# Patient Record
Sex: Male | Born: 1970 | Race: Asian | Marital: Married | Smoking: Never smoker
Health system: Southern US, Community
[De-identification: ages and names within clinical notes are randomized; demographics above are authoritative.]

## PROBLEM LIST (undated history)

## (undated) DIAGNOSIS — E785 Hyperlipidemia, unspecified: Secondary | ICD-10-CM

## (undated) DIAGNOSIS — E119 Type 2 diabetes mellitus without complications: Secondary | ICD-10-CM

## (undated) DIAGNOSIS — I10 Essential (primary) hypertension: Secondary | ICD-10-CM

## (undated) HISTORY — DX: Type 2 diabetes mellitus without complications: E11.9

## (undated) HISTORY — DX: Essential (primary) hypertension: I10

## (undated) HISTORY — DX: Hyperlipidemia, unspecified: E78.5

---

## 2007-04-15 IMAGING — CT CT MAXILLOFACIAL W/O CM
1 of 2 series · 15 of 30 positions shown, 19 images · IV contrast (agent unspecified)
Comparison: none

CLINICAL DATA: History of rhinoplasty with implants.  Recurrent swelling and nasal soreness.  
PARANASAL SINUS CT WITHOUT CONTRAST:
TECHNIQUE: Direct coronal and axial CT images were obtained through the paranasal sinuses without intravenous contrast. Soft tissue 3D images were generated.

[Series 4: axial · axial · 0.33mm/px · z∈[-88,+52]mm · 15 of 64 slices shown, 19 images]
[im 4/64  brain]
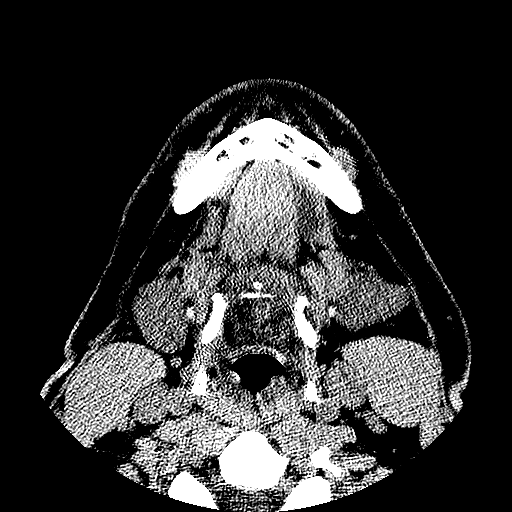
[im 4/64  bone]
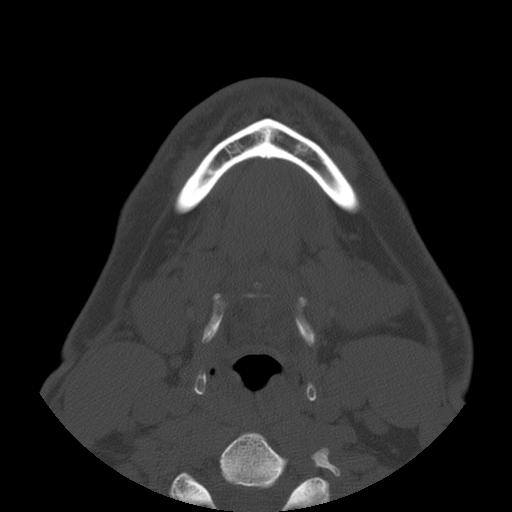
[im 8/64  bone]
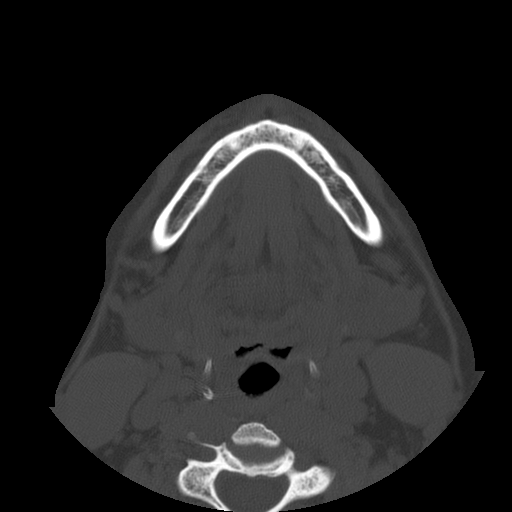
[im 12/64  bone]
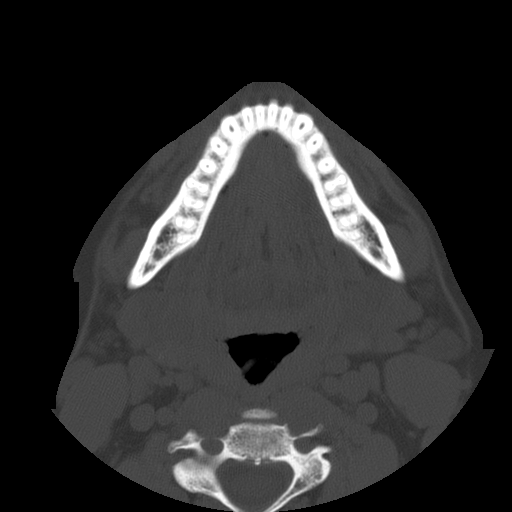
[im 15/64  bone]
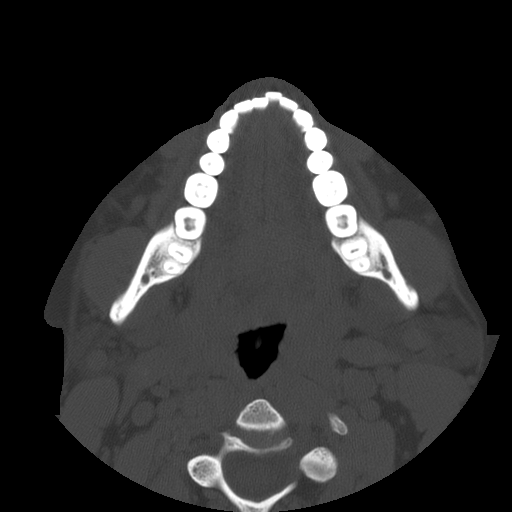
[im 19/64  brain]
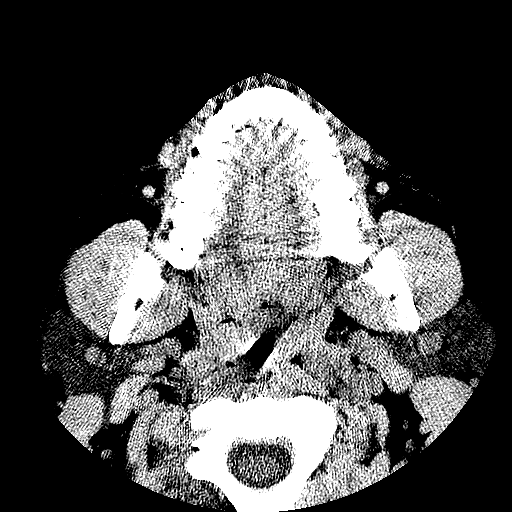
[im 19/64  bone]
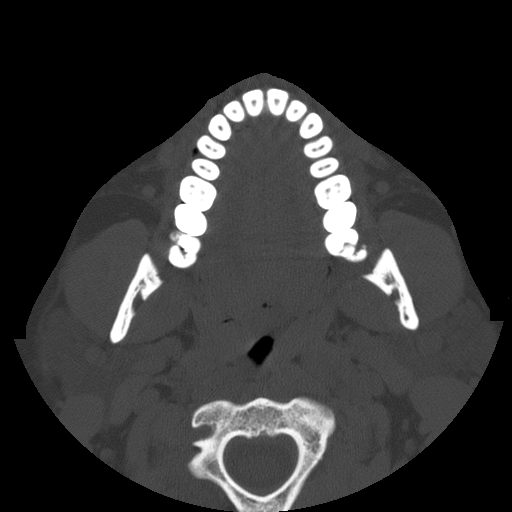
[im 23/64  bone]
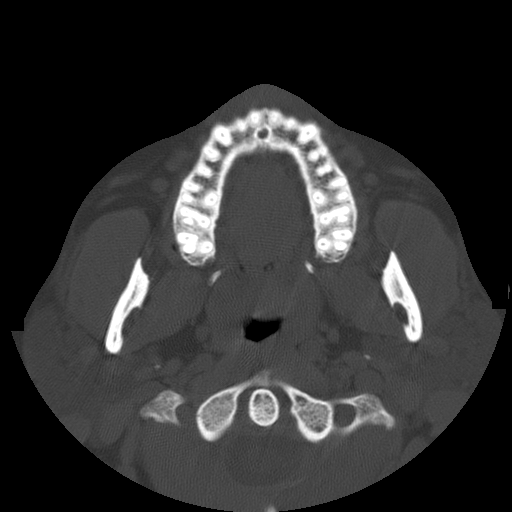
[im 26/64  bone]
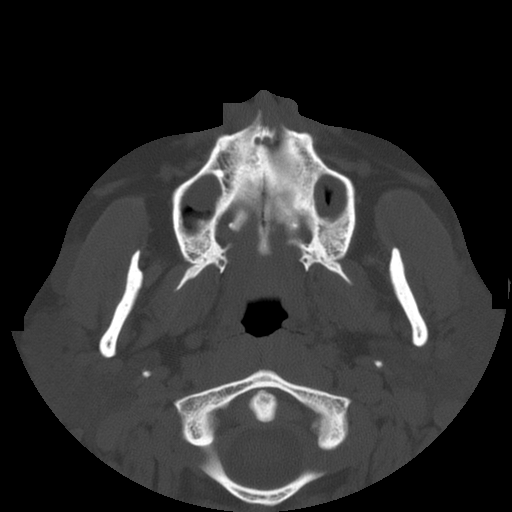
[im 34/64  bone]
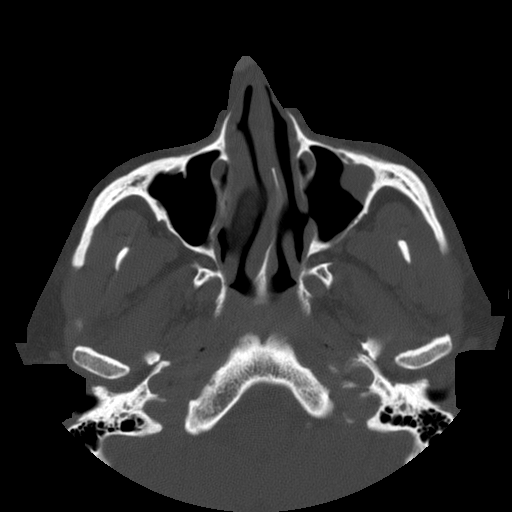
[im 38/64  brain]
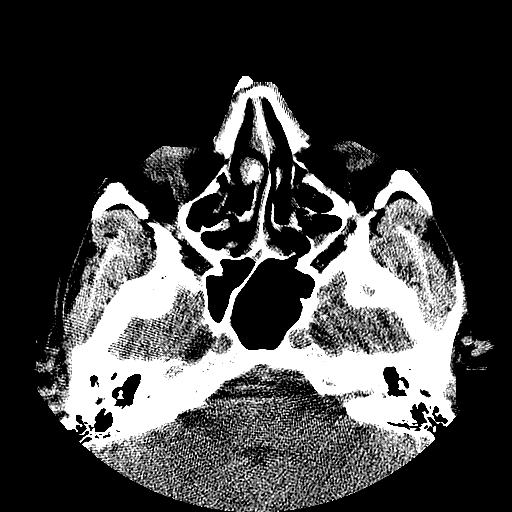
[im 38/64  bone]
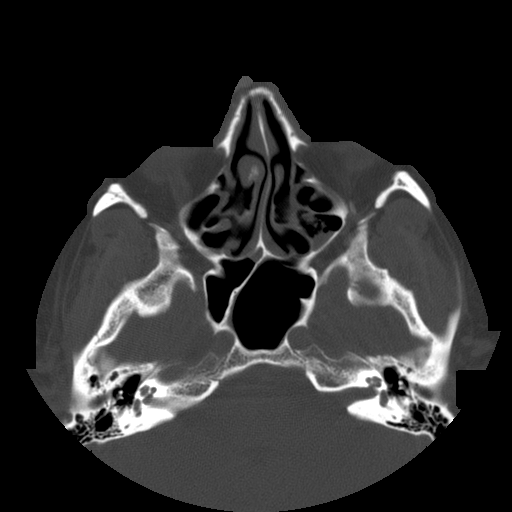
[im 41/64  bone]
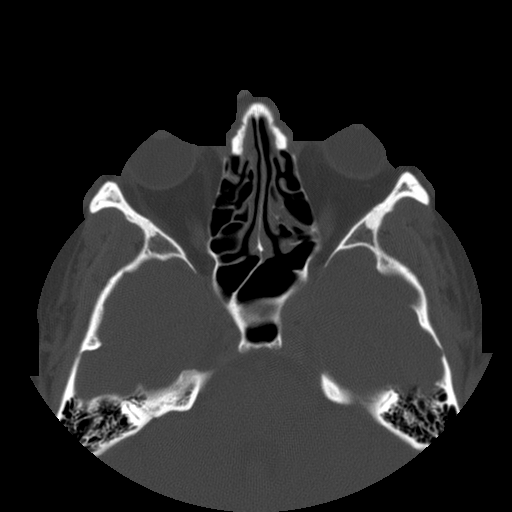
[im 45/64  bone]
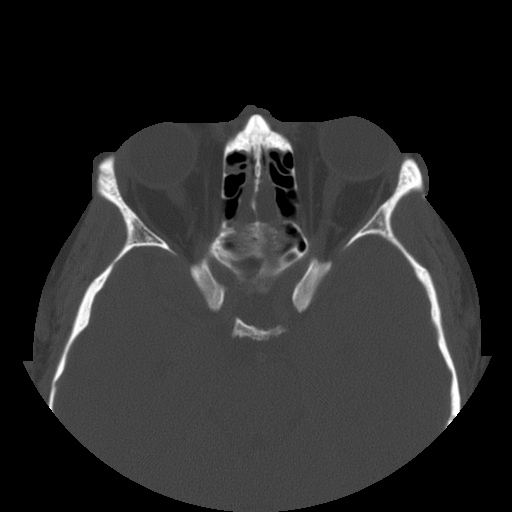
[im 49/64  bone]
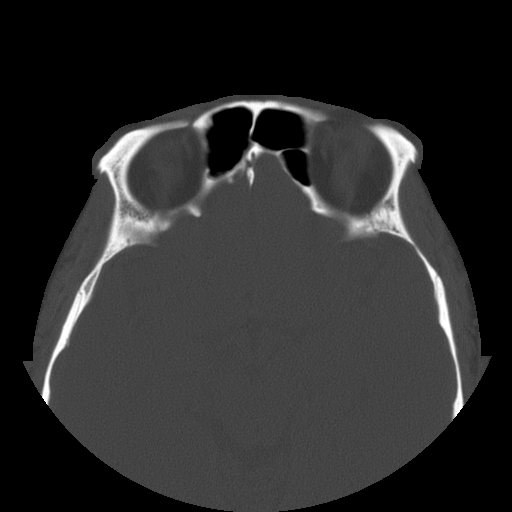
[im 52/64  brain]
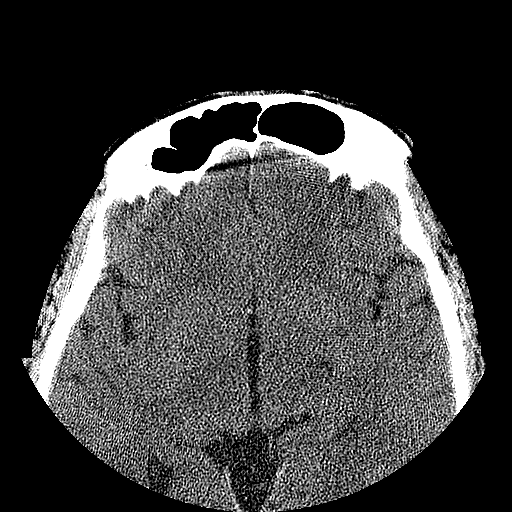
[im 52/64  bone]
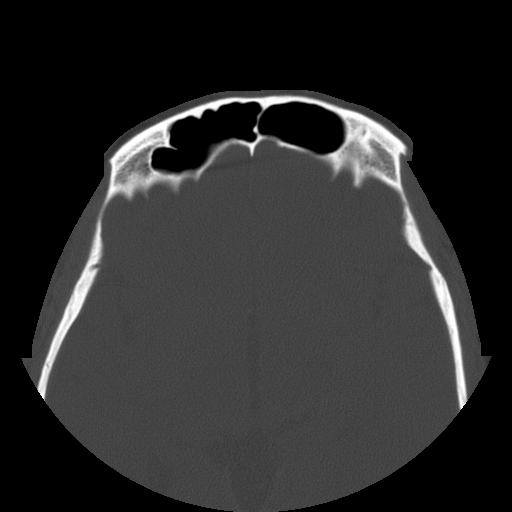
[im 56/64  bone]
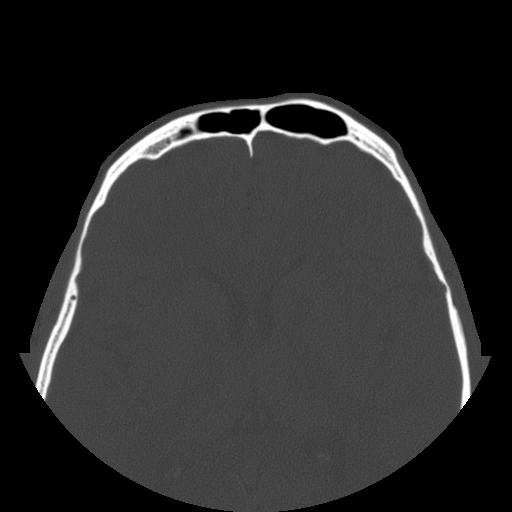
[im 60/64  bone]
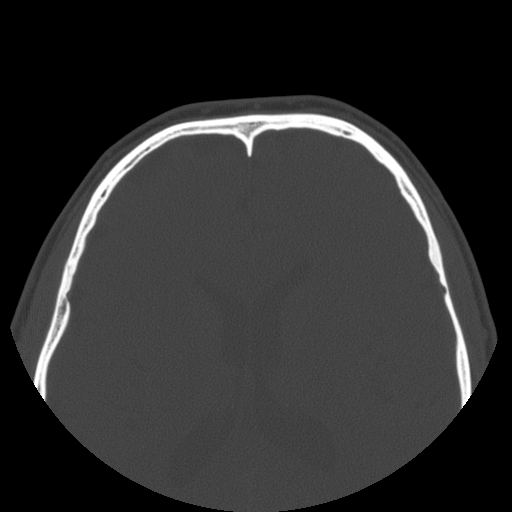

[15 of 30 positions shown; findings below may reference images not displayed]

FINDINGS: Soft tissue implant is noted over the bridge of the nose, slightly off of midline to the right.  There is no evidence of nasal bone fracture or destruction.  Inferior nasal turbinate on the right appears mildly thickened.  There is deviation of the anterior aspect of the bony nasal septum to the left.  Mild infundibular narrowing is present on the left due to an elongated uncinate process and mucosal thickening.  There is additional nodular mucosal thickening in the maxillary sinuses bilaterally.  No air fluid levels are demonstrated.  The frontal, ethmoid, and sphenoid sinuses appear largely clear.  There is minimal opacity in the ethmoid sinuses.  No bone destruction is seen.  Orbital contents appear unremarkable.
IMPRESSION: 1.  Postoperative changes related to nasal soft tissue implant as described.  No bone destruction or obstruction of the nasal passage is identified. 
2.  There is deviation of the bony nasal septum to the left with mild infundibular narrowing on the left and mucosal thickening in the maxillary sinuses bilaterally.  There is also mild ethmoid sinus disease.  No air fluid levels are seen.

## 2019-05-12 ENCOUNTER — Other Ambulatory Visit: Payer: Self-pay | Admitting: "Endocrinology

## 2021-12-29 ENCOUNTER — Other Ambulatory Visit (INDEPENDENT_AMBULATORY_CARE_PROVIDER_SITE_OTHER): Payer: Self-pay | Admitting: "Endocrinology

## 2022-05-19 ENCOUNTER — Encounter (INDEPENDENT_AMBULATORY_CARE_PROVIDER_SITE_OTHER): Payer: Self-pay | Admitting: Internal Medicine

## 2022-05-19 ENCOUNTER — Ambulatory Visit (INDEPENDENT_AMBULATORY_CARE_PROVIDER_SITE_OTHER): Payer: BC Managed Care – PPO | Admitting: Internal Medicine

## 2022-05-19 VITALS — BP 116/78 | HR 80 | Ht 68.0 in | Wt 205.0 lb

## 2022-05-19 DIAGNOSIS — Z8249 Family history of ischemic heart disease and other diseases of the circulatory system: Secondary | ICD-10-CM

## 2022-05-19 DIAGNOSIS — G4733 Obstructive sleep apnea (adult) (pediatric): Secondary | ICD-10-CM

## 2022-05-19 DIAGNOSIS — E785 Hyperlipidemia, unspecified: Secondary | ICD-10-CM

## 2022-05-19 DIAGNOSIS — I158 Other secondary hypertension: Secondary | ICD-10-CM

## 2022-05-19 LAB — ECG 12-LEAD
Atrial Rate: 74 {beats}/min
IHS MUSE NARRATIVE AND IMPRESSION: NORMAL
P Axis: 29 degrees
P-R Interval: 148 ms
Q-T Interval: 374 ms
QRS Duration: 72 ms
QTC Calculation (Bezet): 415 ms
R Axis: 0 degrees
T Axis: 55 degrees
Ventricular Rate: 74 {beats}/min

## 2022-05-19 NOTE — Progress Notes (Signed)
[place] [facility] C[facility] OFFICE CONSULTATION NOTE    [facility]RT [place] [facility] OFFICE - CARDIOLOGY  [address]  [place] [address]  Dept: [phone]  Dept Fax: [phone]         Patient Name: Dennis Wood    Date of Visit:  May 19, 2022  Date of Birth: 11/23/1970  AGE: 51 y.o.  Medical Record #: [id number]  Requesting Physician: Dennis Beams, MD      CHIEF COMPLAINT:  Family history of CAD      HISTORY OF PRESENT ILLNESS    Dennis Wood is being seen today for cardiovascular evaluation at the request of Dennis Levada Dy, MD. He is a pleasant 51 y.o. male who presents for evaluation of family history of CAD.    Patient reports a family history of CAD in his father, who had CABG around age 44.  Patient is interested in getting screened for coronary disease.  He denies any significant chest pain or shortness of breath.    He does report snoring and waking up at night.  He has never been tested for sleep apnea.  He follows closely with his endocrinologist regarding diabetes management.      PAST MEDICAL HISTORY: He has a past medical history of Diabetes mellitus, Hyperlipidemia, and Hypertension. He has no past surgical history on file.    ALLERGIES:   Allergies   Allergen Reactions    Pollen Extract Cough, Itching and Wheezing       MEDICATIONS:  Patient's current medications were reviewed. ONLY Cardiac medications were updated unless others were addressed in assessment and plan.    Current Outpatient Medications:     acarbose (PRECOSE) 25 MG tablet, Take 1 tablet (25 mg) by mouth 3 (three) times daily with meals, Disp: , Rfl:     atorvastatin (LIPITOR) 40 MG tablet, Take 1 tablet (40 mg) by mouth daily, Disp: , Rfl:     ezetimibe (ZETIA) 10 MG tablet, Take 1 tablet (10 mg) by mouth daily, Disp: , Rfl:     Farxiga 10 MG Tab, Take 1 tablet (10 mg) by mouth daily, Disp: , Rfl:     Janumet XR 50-1000 MG Tablet SR 24 hr, , Disp: , Rfl:      levothyroxine (Synthroid) 100 MCG tablet, , Disp: , Rfl:     pioglitazone-metFORMIN (ACTOPLUS MET) 15-500 MG per tablet, , Disp: , Rfl:     Rybelsus 14 MG Tab, , Disp: , Rfl:     vitamin D, ergocalciferol, (DRISDOL) 50000 UNIT Cap, , Disp: , Rfl:      FAMILY HISTORY: family history is not on file.    SOCIAL HISTORY: He reports that he has never smoked. He has never used smokeless tobacco. He reports current alcohol use of about 2.0 standard drinks of alcohol per week. He reports that he does not use drugs.    PHYSICAL EXAMINATION    Visit Vitals  BP 116/78 (BP Site: Right arm, Patient Position: Sitting, Cuff Size: Medium)   Pulse 80   Ht 1.727 m (5\' 8" )   Wt 93 kg (205 lb)   BMI 31.17 kg/m        Constitutional: Cooperative, alert, no acute distress.  Neck: No carotid bruits, JVP normal.  Cardiac: Regular rate and rhythm, normal S1 and S2; no S3 or S4, no murmurs, no rubs, no gallops.  Pulmonary: Clear to auscultation bilaterally, no wheezing, no rhonchi, no rales.  Extremities: no  edema.  Vascular: +2 pulses in radial artery bilaterally, 2+ pedal pulses bilaterally.      ECG: EKG today shows sinus rhythm, no significant abnormalities.      LABS:   No results found for: "WBC", "HGB", "HCT", "PLT"  No results found for: "GLU", "BUN", "CREAT", "NA", "K", "CL", "CO2", "AST", "ALT"  No results found for: "MG", "TSH", "HGBA1C", "BNP"  No results found for: "CHOL", "TRIG", "HDL", "LDL", "LPACHOL"    Labs from September 2023 with total cholesterol 135, HDL 35, LDL 71, triglycerides 192, creatinine 1.0, Normal CMP, A1c 5.3, TSH 2.7.    EKG and Labs personally reviewed.      IMPRESSION:   Dennis Wood is a 51 y.o. male with the following problems:    Hyperlipidemia  Type 2 diabetes  Snoring, waking up at night, concern for OSA  Family history of CAD and CABG in his father around age 62    RECOMMENDATIONS:    He reports a family history of CAD in his father around age 49.  Patient would like to be screened for atherosclerosis  given his family history and risk factors  Check coronary calcium scan to evaluate for coronary calcifications  Will check exercise treadmill stress test to evaluate for ischemia  Check echocardiogram to evaluate heart structure and function  He has symptoms of snoring as well as poor sleep as he wakes up multiple times at night.  There is concern for OSA.  We will check home sleep study to evaluate for OSA  Continue current cardiac meds  Follow-up as needed                                                 Orders Placed This Encounter   Procedures    Cardiac Stress Test    CT Cardiac Scoring    ECG 12 lead (Normal)    Echo Transthoracic Adult Complete    Home Sleep Study (HST)       No orders of the defined types were placed in this encounter.        SIGNED:    Anthoney Harada, MD         This note was generated by the Dragon speech recognition and may contain errors or omissions not intended by the user. Grammatical errors, random word insertions, deletions, pronoun errors, and incomplete sentences are occasional consequences of this technology due to software limitations. Not all errors are caught or corrected. If there are questions or concerns about the content of this note or information contained within the body of this dictation, they should be addressed directly with the author for clarification.

## 2022-05-23 ENCOUNTER — Ambulatory Visit (INDEPENDENT_AMBULATORY_CARE_PROVIDER_SITE_OTHER): Payer: BC Managed Care – PPO | Admitting: Internal Medicine

## 2022-05-23 ENCOUNTER — Encounter (INDEPENDENT_AMBULATORY_CARE_PROVIDER_SITE_OTHER): Payer: Self-pay | Admitting: Internal Medicine

## 2022-05-23 DIAGNOSIS — E785 Hyperlipidemia, unspecified: Secondary | ICD-10-CM

## 2022-05-23 DIAGNOSIS — Z8249 Family history of ischemic heart disease and other diseases of the circulatory system: Secondary | ICD-10-CM

## 2022-05-23 DIAGNOSIS — I158 Other secondary hypertension: Secondary | ICD-10-CM

## 2022-05-23 DIAGNOSIS — G4733 Obstructive sleep apnea (adult) (pediatric): Secondary | ICD-10-CM

## 2022-05-23 NOTE — Procedures (Signed)
EXERCISE STRESS TEST    [facility] [place] [facility]  [place] [facility] OFFICE - CARDIOLOGY  [address]  [place] [address]  Dept: [phone]  Dept Fax: [phone]     Patient: Dennis Wood  Sex: Male   DOB: 1971-02-03 (51 y.o.)  MRN:  [id number]     Test Date:  05/23/2022       Interpretation Date: 05/23/2022    Referring Physician: Aliene Beams, MD     CLINICIAN: Melany Guernsey, CT  SUPERVISING PROVIDER: Anthoney Harada, MD    MEDICATIONS:  Patient has a current medication list which includes the following prescription(s): acarbose - Take 1 tablet (25 mg) by mouth 3 (three) times daily with meals, atorvastatin - Take 1 tablet (40 mg) by mouth daily, ezetimibe - Take 1 tablet (10 mg) by mouth daily, farxiga - Take 1 tablet (10 mg) by mouth daily, janumet xr, levothyroxine, pioglitazone-metformin, rybelsus, and vitamin d (ergocalciferol).    INDICATION:  (ischemic evaluation)    GRADED ECG EXERCISE TEST:    ----- Protocol and Exercise Time -----   Protocol Bruce   Exercise Time (min) 10:00   ----- Heart Rate -----   Resting HR 90 bpm   Maximum HR 176 bpm   METS 11.2 METS   Percent Maximum Predicted HR 104 %    -----Blood Pressure -----   Resting BP 114/24mmHg   Maximum BP 176/80 mmHg     Patient Symptoms: None    Reason for end: Fatigue    Resting ECG: Sinus Rhythm  ST Changes: No significant ST changes from baseline  Stress ECG: Negative for ischemia    Arrhythmias: None    INTERPRETATION:  Normal blood pressure response to exercise.  Exercise capacity average for age and gender.  No chest pain or ischemic ECG changes with exercise.    CONCLUSIONS:  Normal maximal exercise treadmill test with no clinical or ECG evidence of ischemia.  Results and recommendations conveyed to the patient.    INTERPRETED BY: Anthoney Harada, MD

## 2022-05-25 ENCOUNTER — Encounter (INDEPENDENT_AMBULATORY_CARE_PROVIDER_SITE_OTHER): Payer: Self-pay

## 2022-06-26 ENCOUNTER — Ambulatory Visit (INDEPENDENT_AMBULATORY_CARE_PROVIDER_SITE_OTHER): Payer: BC Managed Care – PPO

## 2022-06-26 DIAGNOSIS — Z8249 Family history of ischemic heart disease and other diseases of the circulatory system: Secondary | ICD-10-CM

## 2022-06-26 DIAGNOSIS — G4733 Obstructive sleep apnea (adult) (pediatric): Secondary | ICD-10-CM

## 2022-06-26 DIAGNOSIS — I158 Other secondary hypertension: Secondary | ICD-10-CM

## 2022-06-26 DIAGNOSIS — E785 Hyperlipidemia, unspecified: Secondary | ICD-10-CM

## 2022-06-27 ENCOUNTER — Encounter (INDEPENDENT_AMBULATORY_CARE_PROVIDER_SITE_OTHER): Payer: Self-pay | Admitting: Internal Medicine

## 2022-07-13 ENCOUNTER — Encounter: Payer: BC Managed Care – PPO | Admitting: Pulmonary Disease

## 2022-07-13 ENCOUNTER — Ambulatory Visit (INDEPENDENT_AMBULATORY_CARE_PROVIDER_SITE_OTHER): Payer: BC Managed Care – PPO

## 2022-07-13 ENCOUNTER — Encounter (INDEPENDENT_AMBULATORY_CARE_PROVIDER_SITE_OTHER): Payer: Self-pay

## 2022-07-13 DIAGNOSIS — G4733 Obstructive sleep apnea (adult) (pediatric): Secondary | ICD-10-CM

## 2022-07-13 NOTE — Progress Notes (Signed)
[facility] [place]  [address] [place] [address]  Dept: [phone]  Dept Fax: [phone]     HOME SLEEP TESTING Device Set Up  Set-up Date: July 13, 2022  Set up By: Rodolph Bong, RT    Patient NAME: Dennis Wood   DOB:  Nov 19, 1970  MRN:  [id number]    [place] Unit #:   [id number]  HST Scheduled Return Date: 07/14/2022      All questions and concerns addressed.  Written and verbal instructions provided.     SIGNED:    Rodolph Bong, RT

## 2022-07-14 ENCOUNTER — Encounter (INDEPENDENT_AMBULATORY_CARE_PROVIDER_SITE_OTHER): Payer: Self-pay

## 2022-07-19 ENCOUNTER — Other Ambulatory Visit (INDEPENDENT_AMBULATORY_CARE_PROVIDER_SITE_OTHER): Payer: Self-pay | Admitting: Internal Medicine

## 2022-07-19 DIAGNOSIS — E785 Hyperlipidemia, unspecified: Secondary | ICD-10-CM

## 2022-07-19 DIAGNOSIS — Z8249 Family history of ischemic heart disease and other diseases of the circulatory system: Secondary | ICD-10-CM

## 2022-07-19 DIAGNOSIS — I158 Other secondary hypertension: Secondary | ICD-10-CM

## 2022-07-19 DIAGNOSIS — G4733 Obstructive sleep apnea (adult) (pediatric): Secondary | ICD-10-CM

## 2022-07-26 NOTE — Progress Notes (Unsigned)
SLEEP MEDICINE TELEMEDICINE CONSULTATION NOTE    HRT [place]  [address]  [place] [address]  Dept: [phone]  Dept Fax: [phone]         Patient Name: Dennis Wood    Date of Visit:  July 27, 2022  Date of Birth: 01/11/1971  AGE: 52 y.o.  Medical Record #: [id number]  Requesting Physician: Laurence Ferrari, MD    CHIEF COMPLAINT:  Follow-up (Sleep apnea)      HISTORY OF PRESENT ILLNESS  The visit today was conducted via telemedicine due to COVID-19 precautions.  The patient was in their home and was informed and gave verbal consent to proceed.    Dennis Wood is being seen today for sleep medicine evaluation at the [facility] at the request of Bijal Juanita Laster, MD.     [name] is a pleasant 52 y.o. male new to [facility] presenting for new patient evaluation.  Past medical history significant for hypertension, hyperlipidemia, DM.    He does follow with [facility] cardiology, last seen by Dr. Cleora Fleet 05/19/2022.  He was recommended to have sleep study for evaluation of possible underlying sleep apnea given history of snoring, fragmented sleep and poor quality sleep.    HST 07/13/2022: Mild OSA with AHI 10.7, O2 min 87%, worse in supine position.    Patient states that he has been told by his kids that he does snore.  He does note that he likes time to unwind prior to going to bed so he is usually watching TV and using his phone and ends up going to sleep wait around midnight to 1 AM.  Wakes up between 7 to 8 AM.  He does not usually wake up in the middle of the night but will occasionally take naps during the daytime.  He does drink caffeine daily and alcohol once or twice per week.  No known family history of sleep apnea.  He states he is not particularly sleepy during the daytime.    PAST MEDICAL HISTORY: He has a past medical history of Diabetes mellitus, Hyperlipidemia,  and Hypertension. He has no past surgical history on file.    ALLERGIES:   Allergies   Allergen Reactions    Pollen Extract Cough, Itching and Wheezing       MEDICATIONS:   Patient's current medications were reviewed. ONLY Sleep Medicine related medications were updated unless others were addressed in assessment and plan.  Current Outpatient Medications   Medication Instructions    acarbose (PRECOSE) 25 MG tablet Take 1 tablet (25 mg) by mouth 3 (three) times daily with meals    atorvastatin (LIPITOR) 40 MG tablet Take 1 tablet (40 mg) by mouth daily    ezetimibe (ZETIA) 10 MG tablet Take 1 tablet (10 mg) by mouth daily    Farxiga 10 MG Tab Take 1 tablet (10 mg) by mouth daily    Janumet XR 50-1000 MG Tablet SR 24 hr     levothyroxine (Synthroid) 100 MCG tablet     pioglitazone-metFORMIN (ACTOPLUS MET) 15-500 MG per tablet     Rybelsus 14 MG Tab     vitamin D, ergocalciferol, (DRISDOL) 50000 UNIT Cap        FAMILY HISTORY: family history is not on file.      SOCIAL HISTORY: He reports that he has never smoked. He has never used smokeless tobacco. He reports current alcohol use of  about 2.0 standard drinks of alcohol per week. He reports that he does not use drugs.    REVIEW OF SYSTEMS:    See HPI.      PHYSICAL EXAMINATION    Visit Vitals  Ht 1.727 m (5\' 8" )   Wt 93.4 kg (206 lb)   BMI 31.32 kg/m        Constitutional: Cooperative, alert and oriented,well developed, well nourished, in no acute distress.  Head: Normocephalic, normal hair pattern  ENT: No pallor or cyanosis.  Good dentition, good TMJ movement.  Eyes: conjunctivae and lids normal   Chest: Normal respiration without extremus   Neurological: No gross motor or sensory deficits noted  Psych: affect appropriate, oriented to time, person and place.    LABS:   No results found for: "WBC", "HGB", "HCT", "PLT"  No results found for: "IRON", "TIBC", "FERRITIN"  No results found for: "MG", "TSH", "HGBA1C", "BNP"    Assessment and Plan:  [name] is a 52 y.o.  male presenting today for Sleep Medicine consultation.     Mild obstructive Sleep Apnea:  > Initial presentation: Snoring, fragmented sleep, daytime sleepiness  > Sleep testing: HST 07/13/2022: Mild OSA with AHI 10.7, O2 min 87%, worse in supine position.    We discussed the results of his sleep study in detail.  We discussed the pathophysiology of OSA and risks of untreated sleep apnea including cardiovascular disease, stroke, cognitive deficits, arrhythmias, in addition to potential treatment options including but not limited to positive airway pressure (PAP), oral appliance for sleep apnea, eXcite OSA, upper airway procedures and the like.    1.  At this point patient wishes to give further thought before committing to therapy.  He is considering CPAP therapy and will let Dennis Wood know within the next few weeks regarding his decision.  In the meanwhile he will pursue conservative approach to therapy at this time with lateral sleeping position with head of bed elevation, working on weight loss as well.   2. We discussed how body position during sleep, weight, alcohol and certain anxiety/muscle medications can affect sleep apnea.   3. The patient was advised not to drive or participate in activities requiring sustained vigilance if feeling sleepy.   4. In the mean time, head of bed elevation / side sleeping position was discussed.    Hypertension/hyperlipidemia:  We discussed the relationship between sleep apnea and cardiovascular processes such as hypertension, dysrhythmias, stroke, CAD and the like and how treating underlying sleep apnea could help reduce future risks for occurrence.   1. stable and pt following with [facility] cardiology team.  2. I have reviewed cardiology notes.  3. Echo from 06/2022 reviewed - EF 57%.    DM:  We discussed the relationship between blood sugar control and obstructive sleep apnea and how treating underlying sleep apnea could help with blood sugar control.   1. stable and pt is  following with primary team.    Weight:  We discussed the relationship of body weight and obstructive sleep apnea severity and how changes in body weight could effect sleep apnea severity.    Reviewed above lab work, sleep study results and cardiology notes.                                                    No orders of the  defined types were placed in this encounter.      No orders of the defined types were placed in this encounter.        SIGNED:    Garlan Fair, DO  Sleep Medicine      This note was generated by the Dragon speech recognition and may contain errors or omissions not intended by the user. Grammatical errors, random word insertions, deletions, pronoun errors, and incomplete sentences are occasional consequences of this technology due to software limitations. Not all errors are caught or corrected. If there are questions or concerns about the content of this note or information contained within the body of this dictation, they should be addressed directly with the author for clarification.

## 2022-07-27 ENCOUNTER — Telehealth (INDEPENDENT_AMBULATORY_CARE_PROVIDER_SITE_OTHER): Payer: BC Managed Care – PPO | Admitting: Student in an Organized Health Care Education/Training Program

## 2022-07-27 ENCOUNTER — Encounter (INDEPENDENT_AMBULATORY_CARE_PROVIDER_SITE_OTHER): Payer: Self-pay | Admitting: Student in an Organized Health Care Education/Training Program

## 2022-07-27 VITALS — Ht 68.0 in | Wt 206.0 lb

## 2022-07-27 DIAGNOSIS — I10 Essential (primary) hypertension: Secondary | ICD-10-CM

## 2022-07-27 DIAGNOSIS — Z6831 Body mass index (BMI) 31.0-31.9, adult: Secondary | ICD-10-CM

## 2022-07-27 DIAGNOSIS — E119 Type 2 diabetes mellitus without complications: Secondary | ICD-10-CM

## 2022-07-27 DIAGNOSIS — G4733 Obstructive sleep apnea (adult) (pediatric): Secondary | ICD-10-CM

## 2022-07-27 NOTE — Patient Instructions (Addendum)
Dear Mr. Phillips,    It was a pleasure seeing you today for our Sleep Medicine Appointment.   Please keep [place] updated on your choice regarding CPAP.  Please aim for 7-9 hours of sleep nightly.  Please avoid driving other tasks requiring sustained vigilance of feeling sleepy.    Have a great day,    Garlan Fair, DO      [email or web address]
